# Patient Record
Sex: Female | Born: 1970 | Race: Black or African American | Hispanic: No | Marital: Single | Smoking: Never smoker
Health system: Southern US, Community
[De-identification: ages and names within clinical notes are randomized; demographics above are authoritative.]

## PROBLEM LIST (undated history)

## (undated) DIAGNOSIS — I10 Essential (primary) hypertension: Secondary | ICD-10-CM

## (undated) DIAGNOSIS — I82409 Acute embolism and thrombosis of unspecified deep veins of unspecified lower extremity: Secondary | ICD-10-CM

---

## 1999-10-30 ENCOUNTER — Emergency Department (HOSPITAL_COMMUNITY): Admission: EM | Admit: 1999-10-30 | Discharge: 1999-10-30 | Payer: Self-pay | Admitting: Emergency Medicine

## 2005-05-08 ENCOUNTER — Emergency Department: Payer: Self-pay | Admitting: Emergency Medicine

## 2005-12-19 ENCOUNTER — Emergency Department: Payer: Self-pay | Admitting: Emergency Medicine

## 2006-03-12 ENCOUNTER — Emergency Department: Payer: Self-pay | Admitting: Internal Medicine

## 2006-06-15 ENCOUNTER — Emergency Department: Payer: Self-pay | Admitting: Emergency Medicine

## 2007-04-14 ENCOUNTER — Emergency Department: Payer: Self-pay | Admitting: Emergency Medicine

## 2007-05-29 ENCOUNTER — Emergency Department: Payer: Self-pay | Admitting: Emergency Medicine

## 2007-10-16 ENCOUNTER — Emergency Department: Payer: Self-pay

## 2008-01-25 ENCOUNTER — Emergency Department: Payer: Self-pay | Admitting: Emergency Medicine

## 2008-06-06 ENCOUNTER — Emergency Department: Payer: Self-pay | Admitting: Emergency Medicine

## 2008-07-23 ENCOUNTER — Emergency Department: Payer: Self-pay | Admitting: Emergency Medicine

## 2008-10-14 ENCOUNTER — Inpatient Hospital Stay: Payer: Self-pay | Admitting: Internal Medicine

## 2010-02-07 ENCOUNTER — Ambulatory Visit: Payer: Self-pay | Admitting: Orthopedic Surgery

## 2011-06-09 ENCOUNTER — Emergency Department: Payer: Self-pay | Admitting: Emergency Medicine

## 2011-10-07 ENCOUNTER — Emergency Department: Payer: Self-pay | Admitting: Emergency Medicine

## 2012-01-02 ENCOUNTER — Emergency Department: Payer: Self-pay | Admitting: Emergency Medicine

## 2012-07-16 ENCOUNTER — Ambulatory Visit: Payer: Self-pay

## 2012-07-16 LAB — DOT URINE DIP
Glucose,UR: NEGATIVE mg/dL (ref 0–75)
Specific Gravity: 1.025 (ref 1.003–1.030)

## 2012-08-23 ENCOUNTER — Emergency Department: Payer: Self-pay | Admitting: Emergency Medicine

## 2013-10-08 ENCOUNTER — Emergency Department: Payer: Self-pay | Admitting: Emergency Medicine

## 2013-10-12 ENCOUNTER — Emergency Department: Payer: Self-pay | Admitting: Emergency Medicine

## 2014-08-28 ENCOUNTER — Emergency Department: Payer: Self-pay

## 2014-08-28 ENCOUNTER — Emergency Department
Admission: EM | Admit: 2014-08-28 | Discharge: 2014-08-28 | Disposition: A | Discharge status: Home / Self Care | Payer: Self-pay | Attending: Emergency Medicine | Admitting: Emergency Medicine

## 2014-08-28 DIAGNOSIS — S86811A Strain of other muscle(s) and tendon(s) at lower leg level, right leg, initial encounter: Secondary | ICD-10-CM | POA: Insufficient documentation

## 2014-08-28 DIAGNOSIS — S86911A Strain of unspecified muscle(s) and tendon(s) at lower leg level, right leg, initial encounter: Secondary | ICD-10-CM

## 2014-08-28 DIAGNOSIS — X58XXXA Exposure to other specified factors, initial encounter: Secondary | ICD-10-CM | POA: Insufficient documentation

## 2014-08-28 DIAGNOSIS — Y92003 Bedroom of unspecified non-institutional (private) residence as the place of occurrence of the external cause: Secondary | ICD-10-CM | POA: Insufficient documentation

## 2014-08-28 DIAGNOSIS — Y9389 Activity, other specified: Secondary | ICD-10-CM | POA: Insufficient documentation

## 2014-08-28 DIAGNOSIS — M25461 Effusion, right knee: Secondary | ICD-10-CM | POA: Insufficient documentation

## 2014-08-28 DIAGNOSIS — Y998 Other external cause status: Secondary | ICD-10-CM | POA: Insufficient documentation

## 2014-08-28 MED ORDER — IBUPROFEN 800 MG PO TABS: 800.0000 mg | ORAL_TABLET | Freq: Three times a day (TID) | ORAL | Status: DC | PRN

## 2014-08-28 MED ORDER — HYDROCODONE-ACETAMINOPHEN 5-325 MG PO TABS
2.0000 | ORAL_TABLET | Freq: Once | ORAL | Status: AC
  Administered 2014-08-28: 2 via ORAL
  Filled 2014-08-28: qty 2

## 2014-08-28 MED ORDER — HYDROCODONE-ACETAMINOPHEN 5-325 MG PO TABS: 1.0000 | ORAL_TABLET | ORAL | Status: DC | PRN

## 2014-08-28 MED ORDER — KETOROLAC TROMETHAMINE 60 MG/2ML IM SOLN
60.0000 mg | Freq: Once | INTRAMUSCULAR | Status: AC
  Administered 2014-08-28: 60 mg via INTRAMUSCULAR
  Filled 2014-08-28: qty 2

## 2014-08-28 NOTE — ED Provider Notes (Signed)
[facility] Emergency Department Provider Note  ____________________________________________  Time seen: Approximately 3:00 PM  I have reviewed the triage vital signs and the nursing notes.   HISTORY  Chief Complaint Knee Pain    HPI Stephanie Booth is a 44 y.o. female who presents for evaluation of pain in her right knee. Patient states that she got out of bed 2 days ago and felt a tearing sensation in her knee.Complains of increased pain in the right knee. Symptoms are worsened with ambulation and relieved with laying flat.   No past medical history on file.  There are no active problems to display for this patient.   No past surgical history on file.  Current Outpatient Rx  Name  Route  Sig  Dispense  Refill  . HYDROcodone-acetaminophen (NORCO) 5-325 MG per tablet   Oral   Take 1-2 tablets by mouth every 4 (four) hours as needed for moderate pain.   15 tablet   0   . ibuprofen (ADVIL,MOTRIN) 800 MG tablet   Oral   Take 1 tablet (800 mg total) by mouth every 8 (eight) hours as needed.   30 tablet   0     Allergies Review of patient's allergies indicates no known allergies.  No family history on file.  Social History History  Substance Use Topics  . Smoking status: Not on file  . Smokeless tobacco: Not on file  . Alcohol Use: Not on file    Review of Systems Constitutional: No fever/chills Eyes: No visual changes. ENT: No sore throat. Cardiovascular: Denies chest pain. Respiratory: Denies shortness of breath. Gastrointestinal: No abdominal pain.  No nausea, no vomiting.  No diarrhea.  No constipation. Genitourinary: Negative for dysuria. Musculoskeletal: Positive for right knee pain. Skin: Negative for rash. Neurological: Negative for headaches, focal weakness or numbness.  10-point ROS otherwise negative.  ____________________________________________   PHYSICAL EXAM:  VITAL SIGNS: ED Triage Vitals  Enc Vitals  Group     BP 08/28/14 1328 146/83 mmHg     Pulse Rate 08/28/14 1328 96     Resp 08/28/14 1328 18     Temp 08/28/14 1328 97.8 F (36.6 C)     Temp Source 08/28/14 1328 Oral     SpO2 08/28/14 1328 97 %     Weight 08/28/14 1328 295 lb (133.811 kg)     Height 08/28/14 1328 5\' 9"  (1.753 m)     Head Cir --      Peak Flow --      Pain Score 08/28/14 1341 10     Pain Loc --      Pain Edu? --      Excl. in GC? --     Constitutional: Alert and oriented. Well appearing and in mild distress. Morbidly obese. Cardiovascular: Normal rate, regular rhythm. Grossly normal heart sounds.  Good peripheral circulation. Respiratory: Normal respiratory effort.  No retractions. Lungs CTAB. Gastrointestinal: Soft and nontender. No distention. No abdominal bruits. No CVA tenderness. Musculoskeletal: Tender both anterior and laterally medially. Increased pain with anterior and posterior drawer. Difficult to determine to edema secondary to obesity. Neurologic:  Normal speech and language. No gross focal neurologic deficits are appreciated. No gait instability. Skin:  Skin is warm, dry and intact. No rash noted. Psychiatric: Mood and affect are normal. Speech and behavior are normal.  ____________________________________________   LABS (all labs ordered are listed, but only abnormal results are displayed)  Labs Reviewed - No data to display ____________________________________________  RADIOLOGY  Tri-compartmental arthritis interpreted by radiologist few by myself. Patient had both CT and x-rays of her right knee. ____________________________________________   PROCEDURES  Procedure(s) performed: None  Critical Care performed: No  ____________________________________________   INITIAL IMPRESSION / ASSESSMENT AND PLAN / ED COURSE  Pertinent labs & imaging results that were available during my care of the patient were reviewed by me and considered in my medical decision making (see chart for  details).  Degenerative joint changes and tract with marked mental arthritis of right knee. Rx given for Flexeril 10 mg 3 times a day continue current medications follow-up as needed with orthopedics or her PCP. ____________________________________________   FINAL CLINICAL IMPRESSION(S) / ED DIAGNOSES  Final diagnoses:  Knee strain, right, initial encounter  Knee effusion, right      Evangeline Dakin, PA-C 08/28/14 1840  Arnaldo Natal, MD 08/28/14 2325

## 2014-08-28 NOTE — ED Notes (Signed)
Pt states Wednesday when she stepped down she heard a tearing noise and had pain in the rt knee, pt states that she has been having pain since with swelling in the knee and ankle, pt states that she cont to have pain

## 2014-08-28 NOTE — Discharge Instructions (Signed)
Knee Effusion The medical term for having fluid in your knee is effusion. This is often due to an internal derangement of the knee. This means something is wrong inside the knee. Some of the causes of fluid in the knee may be torn cartilage, a torn ligament, or bleeding into the joint from an injury. Your knee is likely more difficult to bend and move. This is often because there is increased pain and pressure in the joint. The time it takes for recovery from a knee effusion depends on different factors, including:   Type of injury.  Your age.  Physical and medical conditions.  Rehabilitation Strategies. How long you will be away from your normal activities will depend on what kind of knee problem you have and how much damage is present. Your knee has two types of cartilage. Articular cartilage covers the bone ends and lets your knee bend and move smoothly. Two menisci, thick pads of cartilage that form a rim inside the joint, help absorb shock and stabilize your knee. Ligaments bind the bones together and support your knee joint. Muscles move the joint, help support your knee, and take stress off the joint itself. CAUSES  Often an effusion in the knee is caused by an injury to one of the menisci. This is often a tear in the cartilage. Recovery after a meniscus injury depends on how much meniscus is damaged and whether you have damaged other knee tissue. Small tears may heal on their own with conservative treatment. Conservative means rest, limited weight bearing activity and muscle strengthening exercises. Your recovery may take up to 6 weeks.  TREATMENT  Larger tears may require surgery. Meniscus injuries may be treated during arthroscopy. Arthroscopy is a procedure in which your surgeon uses a small telescope like instrument to look in your knee. Your caregiver can make a more accurate diagnosis (learning what is wrong) by performing an arthroscopic procedure. If your injury is on the inner margin  of the meniscus, your surgeon may trim the meniscus back to a smooth rim. In other cases your surgeon will try to repair a damaged meniscus with stitches (sutures). This may make rehabilitation take longer, but may provide better long term result by helping your knee keep its shock absorption capabilities. Ligaments which are completely torn usually require surgery for repair. HOME CARE INSTRUCTIONS  Use crutches as instructed.  If a brace is applied, use as directed.  Once you are home, an ice pack applied to your swollen knee may help with discomfort and help decrease swelling.  Keep your knee raised (elevated) when you are not up and around or on crutches.  Only take over-the-counter or prescription medicines for pain, discomfort, or fever as directed by your caregiver.  Your caregivers will help with instructions for rehabilitation of your knee. This often includes strengthening exercises.  You may resume a normal diet and activities as directed. SEEK MEDICAL CARE IF:   There is increased swelling in your knee.  You notice redness, swelling, or increasing pain in your knee.  An unexplained oral temperature above 102 F (38.9 C) develops. SEEK IMMEDIATE MEDICAL CARE IF:   You develop a rash.  You have difficulty breathing.  You have any allergic reactions from medications you may have been given.  There is severe pain with any motion of the knee. MAKE SURE YOU:   Understand these instructions.  Will watch your condition.  Will get help right away if you are not doing well or get worse.  Document Released: 04/08/2003 Document Revised: 04/10/2011 Document Reviewed: 06/12/2007 [facility] Patient Information 2015 [place], [place]. This information is not intended to replace advice given to you by your health care provider. Make sure you discuss any questions you have with your health care provider.  Ligament Sprain A ligament sprain is when the bands of tissue that hold bones  together (ligament) are stretched. HOME CARE   Rest the injured area.  Start using the joint when told to by your doctor.  Keep the injured area raised (elevated) above the level of the heart. This may lessen puffiness (swelling).  Put ice on the injured area.  Put ice in a plastic bag.  Place a towel between your skin and the bag.  Leave the ice on for 15-20 minutes, 03-04 times a day.  Wear a splint, cast, or an elastic bandage as told by your doctor.  Only take medicine as told by your doctor.  Use crutches as told by your doctor. Do not put weight on the injured joint until told to by your doctor. GET HELP RIGHT AWAY IF:   You have more bruising, puffiness, or pain.  The leg was injured and the toes are cold, tingling, numb, or blue.  The arm was injured and the fingers are cold, tingling, numb, or blue.  The pain is not helped with medicine.  The pain gets worse. MAKE SURE YOU:   Understand these instructions.  Will watch this condition.  Will get help right away if you are not doing well or get worse. Document Released: 07/05/2007 Document Revised: 11/06/2012 Document Reviewed: 07/05/2007 [facility] Patient Information 2015 [place], [place]. This information is not intended to replace advice given to you by your health care provider. Make sure you discuss any questions you have with your health care provider.

## 2014-08-28 NOTE — ED Notes (Signed)
Few days ago stepped out of bed c/o pain in right knee, has some swelling in right ankle

## 2016-03-19 ENCOUNTER — Encounter: Payer: Self-pay | Admitting: Emergency Medicine

## 2016-03-19 ENCOUNTER — Emergency Department
Admission: EM | Admit: 2016-03-19 | Discharge: 2016-03-19 | Disposition: A | Discharge status: Home / Self Care | Payer: Self-pay | Attending: Emergency Medicine | Admitting: Emergency Medicine

## 2016-03-19 DIAGNOSIS — H6992 Unspecified Eustachian tube disorder, left ear: Secondary | ICD-10-CM | POA: Insufficient documentation

## 2016-03-19 DIAGNOSIS — H6982 Other specified disorders of Eustachian tube, left ear: Secondary | ICD-10-CM

## 2016-03-19 MED ORDER — PREDNISONE 10 MG PO TABS: ORAL_TABLET | ORAL | 0 refills | Status: DC

## 2016-03-19 NOTE — Discharge Instructions (Signed)
Follow up with your doctor Tuesday with your ear problems. Begin taking prednisone today and taper down.  Continue taking flonase daily. You may continue taking tylenol or ibuprofen as needed for pain

## 2016-03-19 NOTE — ED Provider Notes (Signed)
[facility] Emergency Department Provider Note  ____________________________________________   First MD Initiated Contact with Patient 03/19/16 1046     (approximate)  I have reviewed the triage vital signs and the nursing notes.   HISTORY  Chief Complaint Otalgia    HPI Stephanie Booth is a 46 y.o. female is able complaint of left ear pain for approximately 2-3 weeks. She has been using multiple home remedies including sweet oil, olive oil, over the counter ear drops and ibuprofen.  She googled more home remedies and tried several without any improvement. She states she continues to use Flonase for sinus problems. She denies any fever. Patient denies any upper respiratory symptoms. She is unaware of any hearing changes. She states that she has an appointment with her PCP on Tuesday but cannot endure the pain any longer. Currently she rates her pain as a 10 over 10.   History reviewed. No pertinent past medical history.  There are no active problems to display for this patient.   History reviewed. No pertinent surgical history.  Prior to Admission medications   Medication Sig Start Date End Date Taking? Authorizing Provider  predniSONE (DELTASONE) 10 MG tablet Take 6 tablets  today, on day 2 take 5 tablets, day 3 take 4 tablets, day 4 take 3 tablets, day 5 take  2 tablets and 1 tablet the last day 03/19/16   Tommi Rumpshonda L Terisha Losasso, PA-C    Allergies Patient has no known allergies.  No family history on file.  Social History Social History  Substance Use Topics  . Smoking status: Never Smoker  . Smokeless tobacco: Never Used  . Alcohol use No    Review of Systems Constitutional: Subjective fever/chills Eyes: No visual changes. ENT: No sore throat. Positive left ear pain. Cardiovascular: Denies chest pain. Respiratory: Denies shortness of breath. Gastrointestinal: No abdominal pain.  No nausea, no vomiting.   Musculoskeletal: Negative for back  pain. Skin: Negative for rash. Neurological: Negative for headaches, focal weakness or numbness.  10-point ROS otherwise negative.  ____________________________________________   PHYSICAL EXAM:  VITAL SIGNS: ED Triage Vitals  Enc Vitals Group     BP 03/19/16 1019 (!) 149/69     Pulse Rate 03/19/16 1019 94     Resp 03/19/16 1019 16     Temp 03/19/16 1019 98 F (36.7 C)     Temp src --      SpO2 03/19/16 1019 98 %     Weight 03/19/16 1016 (!) 350 lb (158.8 kg)     Height 03/19/16 1016 5\' 10"  (1.778 m)     Head Circumference --      Peak Flow --      Pain Score 03/19/16 1016 10     Pain Loc --      Pain Edu? --      Excl. in GC? --     Constitutional: Alert and oriented. Well appearing and in no acute distress.Morbidly obese. Eyes: Conjunctivae are normal. PERRL. EOMI. Head: Atraumatic. Nose: Mild congestion/rhinnorhea.  Right EAC and TM are clear. Left EAC is clear. TM with mild fluid and dull. No injection or erythema was noted. Patient was unable to get her ears to pop and increased the pain on her left side. Mouth/Throat: Mucous membranes are moist.  Oropharynx non-erythematous. Posterior drainage Neck: No stridor.   Hematological/Lymphatic/Immunilogical: No cervical lymphadenopathy. Cardiovascular: Normal rate, regular rhythm. Grossly normal heart sounds.  Good peripheral circulation. Respiratory: Normal respiratory effort.  No retractions. Lungs CTAB.  Musculoskeletal: Moves upper and lower extremities without any difficulty.  Normal gait was noted. Neurologic:  Normal speech and language. No gross focal neurologic deficits are appreciated. No gait instability. Skin:  Skin is warm, dry and intact. No rash noted. Psychiatric: Mood and affect are normal. Speech and behavior are normal.  ____________________________________________   LABS (all labs ordered are listed, but only abnormal results are displayed)  Labs Reviewed - No data to  display  PROCEDURES  Procedure(s) performed: None  Procedures  Critical Care performed: No  ____________________________________________   INITIAL IMPRESSION / ASSESSMENT AND PLAN / ED COURSE  Pertinent labs & imaging results that were available during my care of the patient were reviewed by me and considered in my medical decision making (see chart for details).  Patient was told to continue using her Flonase daily. She may continue taking Tylenol or ibuprofen as needed for pain. Patient is encouraged to keep her appointment Tuesday with her PCP. She was started on a tapering dose of prednisone starting at 60 mg and taper for the next 6 days.    ____________________________________________   FINAL CLINICAL IMPRESSION(S) / ED DIAGNOSES  Final diagnoses:  Eustachian tube dysfunction, left      NEW MEDICATIONS STARTED DURING THIS VISIT:  Discharge Medication List as of 03/19/2016 11:10 AM    START taking these medications   Details  predniSONE (DELTASONE) 10 MG tablet Take 6 tablets  today, on day 2 take 5 tablets, day 3 take 4 tablets, day 4 take 3 tablets, day 5 take  2 tablets and 1 tablet the last day, Print         Note:  This document was prepared using Dragon voice recognition software and may include unintentional dictation errors.    Tommi Rumps, PA-C 03/19/16 1118    Sharman Cheek, MD 03/19/16 [phone]

## 2016-03-19 NOTE — ED Notes (Signed)
See triage note  States she developed left ear pain about 1 week ago or so  States pain eased off but then returned couple of days ago

## 2016-03-19 NOTE — ED Triage Notes (Signed)
Pt to ED via POV for left ear pain, patient has tried home remedies without relief. Pt states that she has had fevers, Pt denies nasal congestion and cough.

## 2017-02-06 IMAGING — CR DG KNEE COMPLETE 4+V*R*
4 series · 4 of 4 positions shown · non-contrast
Comparison: None.

CLINICAL DATA: Fell out of bed last night.  Unable to bear weight.

EXAM:
RIGHT KNEE - COMPLETE 4+ VIEW

[knee ap]
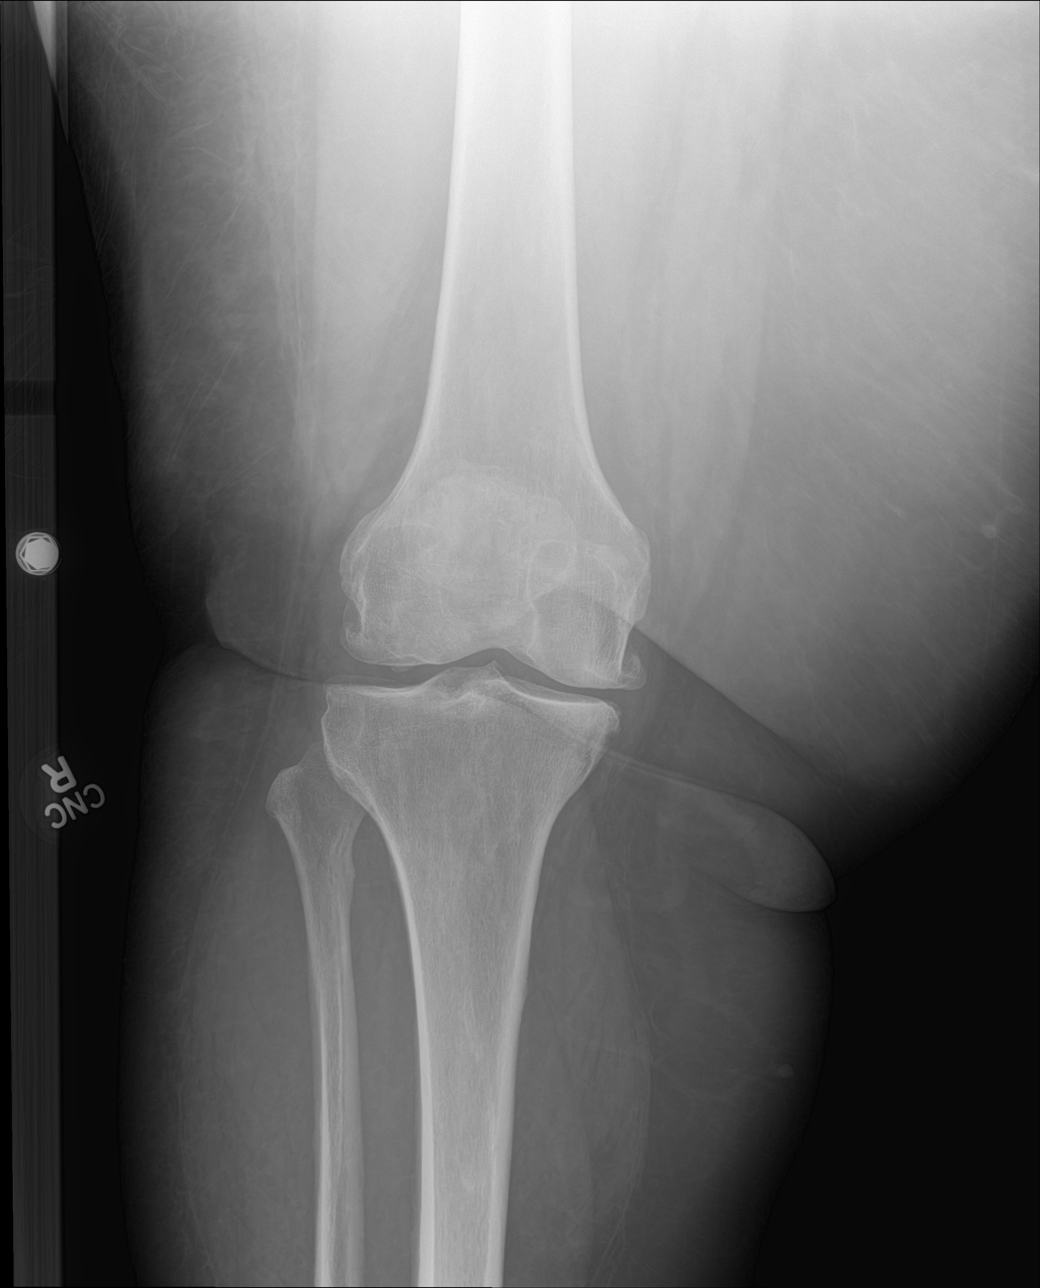

[knee obl (1 of 2)]
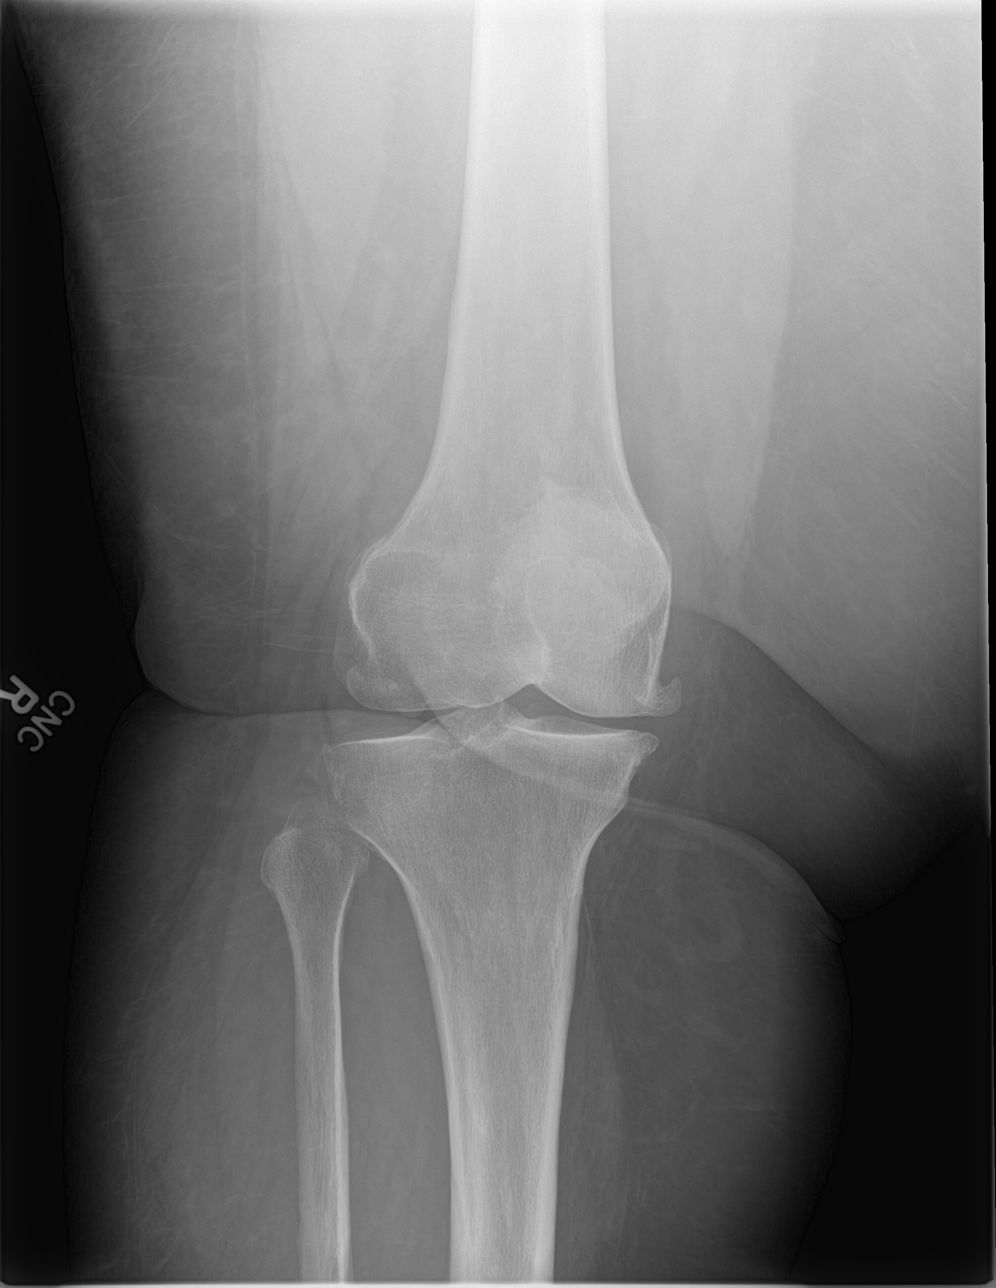

[knee obl (2 of 2)]
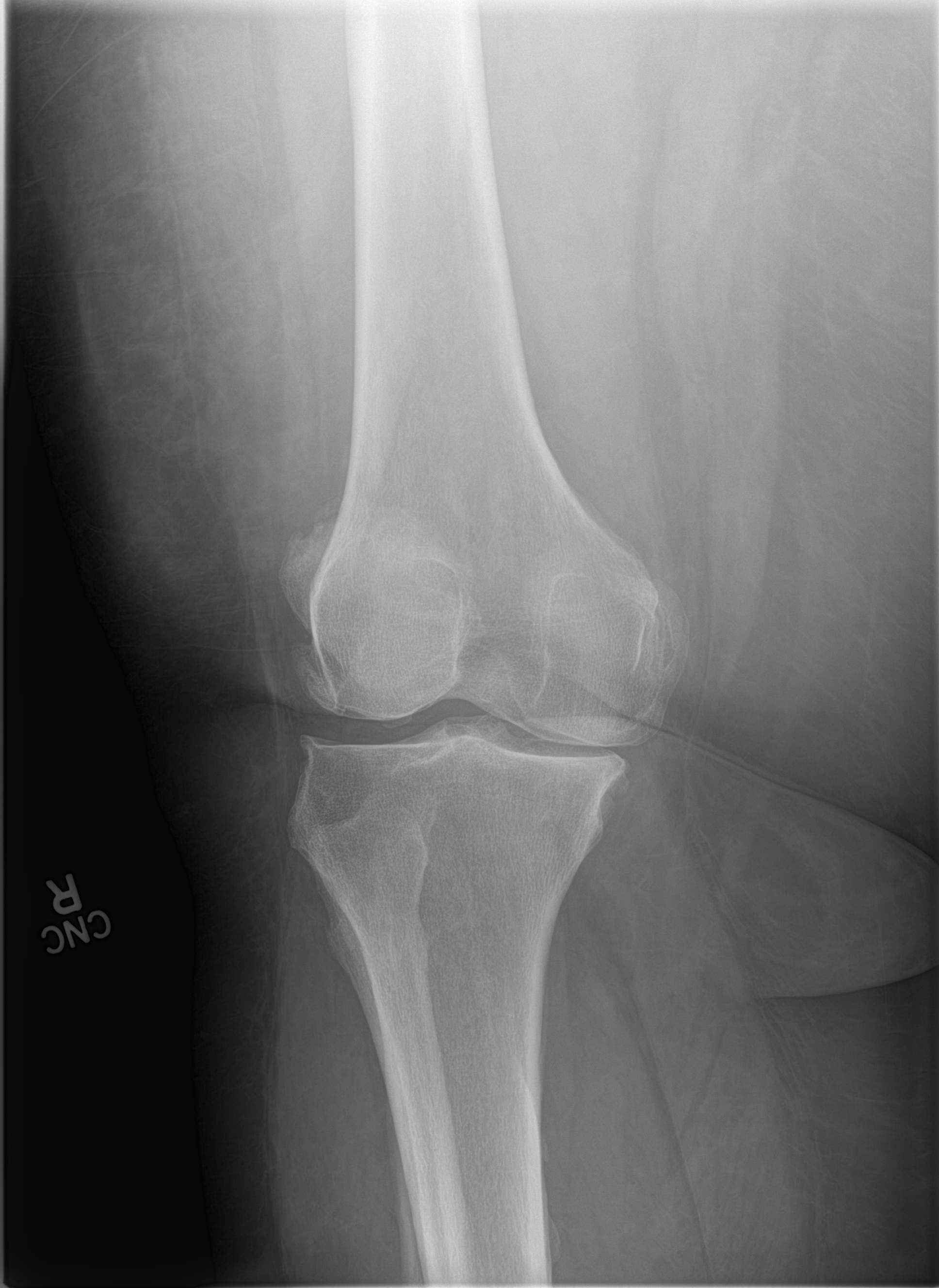

[knee lat]
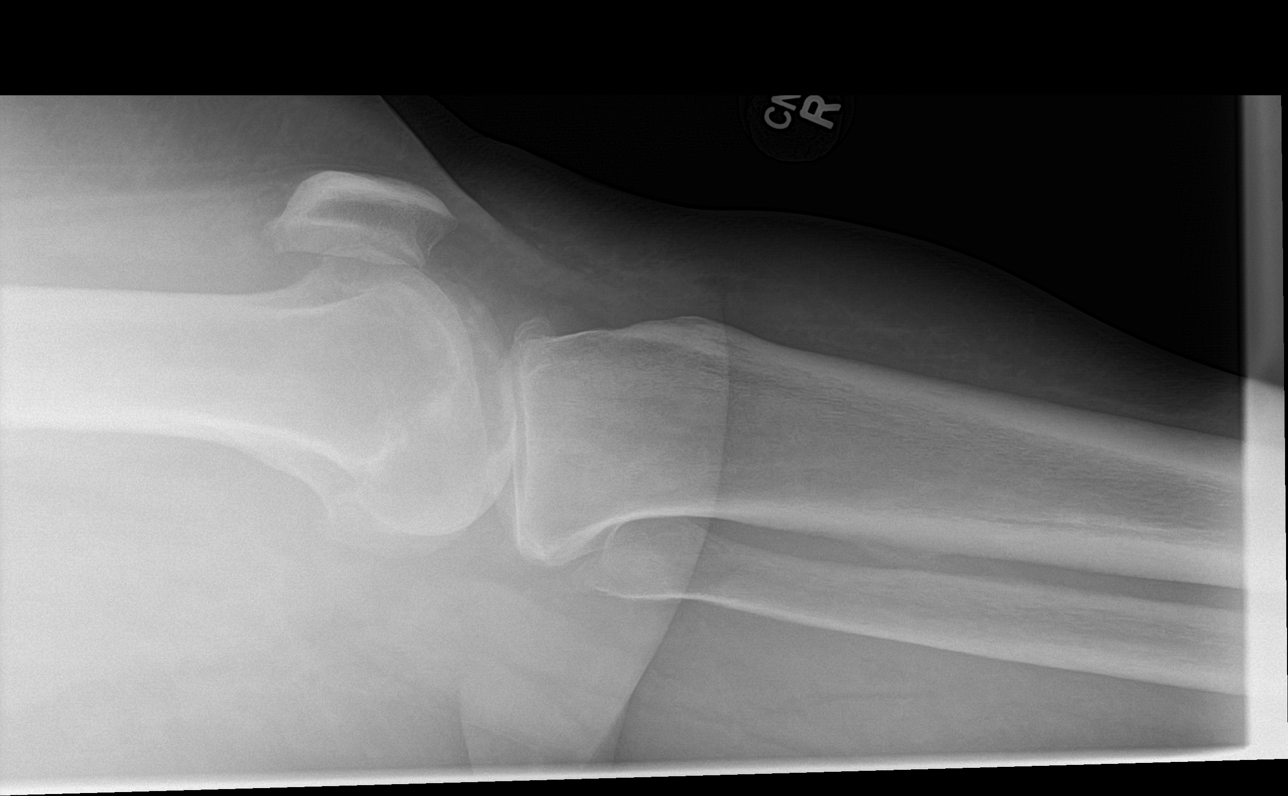

[4 of 4 positions shown; findings below may reference images not displayed]

FINDINGS: No acute fracture or dislocation. Mild tricompartmental
osteoarthritis of the right knee. No joint effusion. No lytic or
blastic osseous lesion.
IMPRESSION: Mild tricompartmental osteoarthritis of the right knee.

## 2017-02-06 IMAGING — CT CT KNEE*R* W/O CM
2 series · 10 of 14 positions shown, 11 images · non-contrast
Comparison: Radiographs dated 08/28/2014

CLINICAL DATA: Right knee pain and swelling since the patient
injured knee while stepping down 2 days ago.

EXAM:
CT OF THE RIGHT KNEE WITHOUT CONTRAST
TECHNIQUE: Multidetector CT imaging of the right knee was performed according
to the standard protocol. Multiplanar CT image reconstructions were
also generated.

[Series 2: knee · axial · 0.54mm/px · z∈[-195,-51]mm · 6 of 136 slices shown]
[im 20/136  bone]
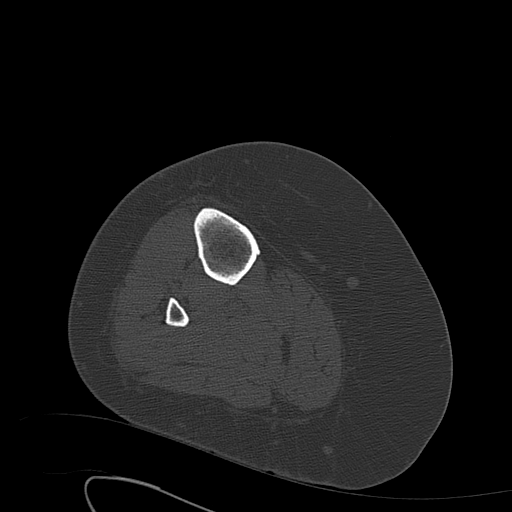
[im 39/136  bone]
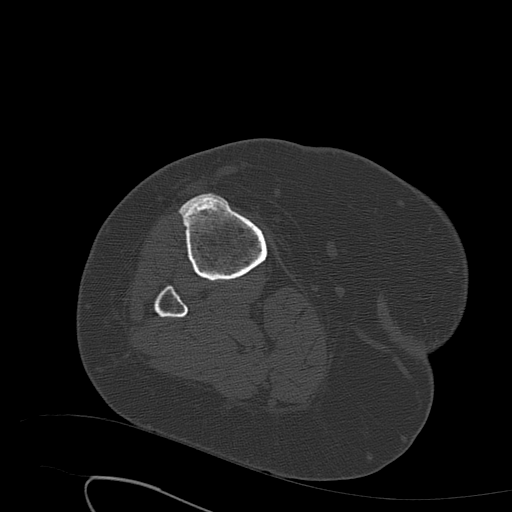
[im 58/136  bone]
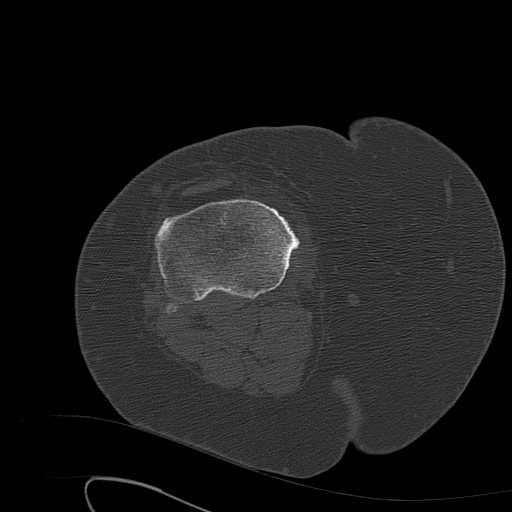
[im 78/136  bone]
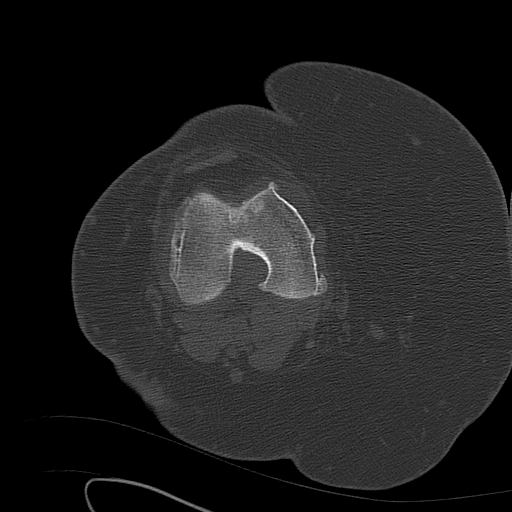
[im 97/136  bone]
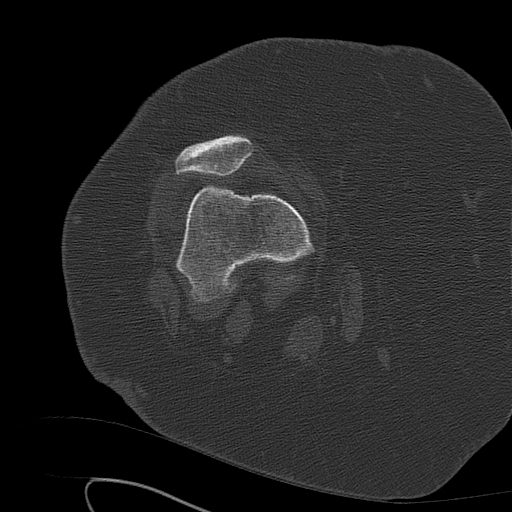
[im 116/136  bone]
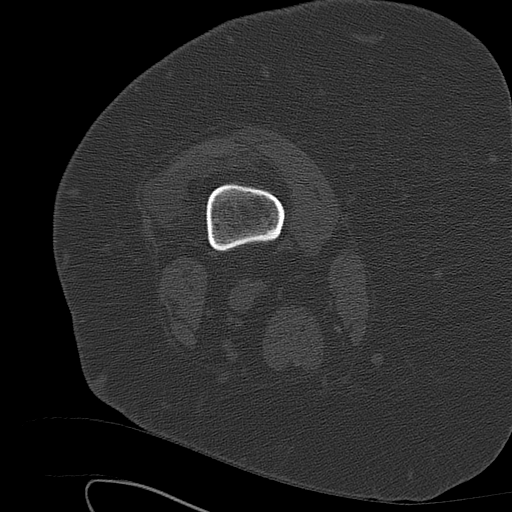

[Series 8: axials st 2 · axial · 0.51mm/px · z∈[-184,-66]mm · 4 of 102 slices shown, 5 images]
[im 21/102  soft-tissue]
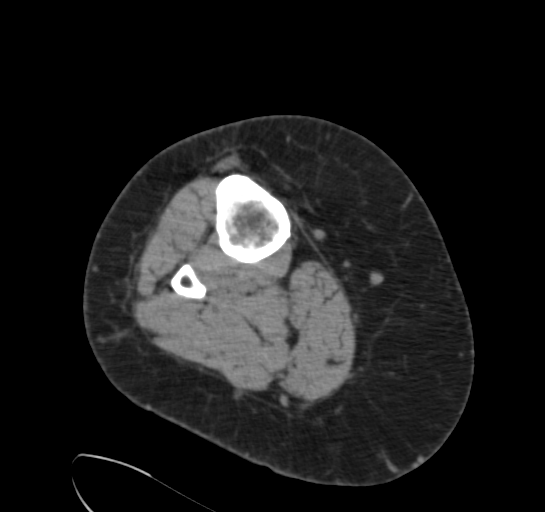
[im 21/102  bone]
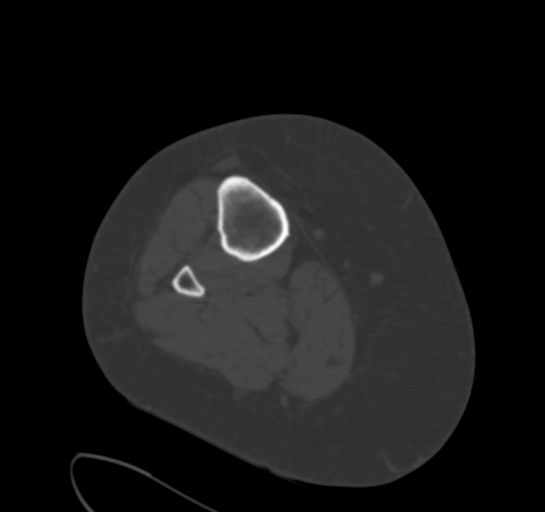
[im 41/102  bone]
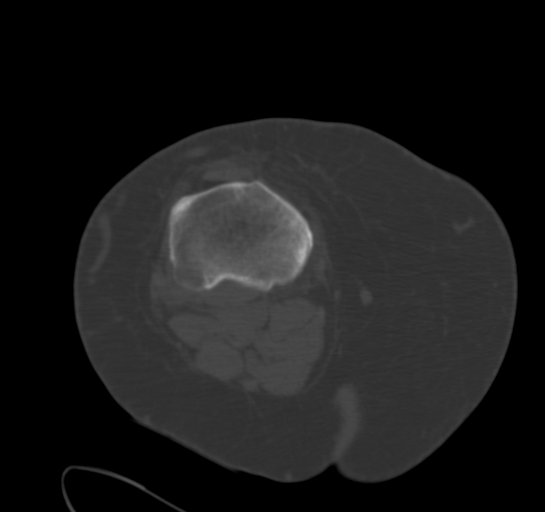
[im 61/102  bone]
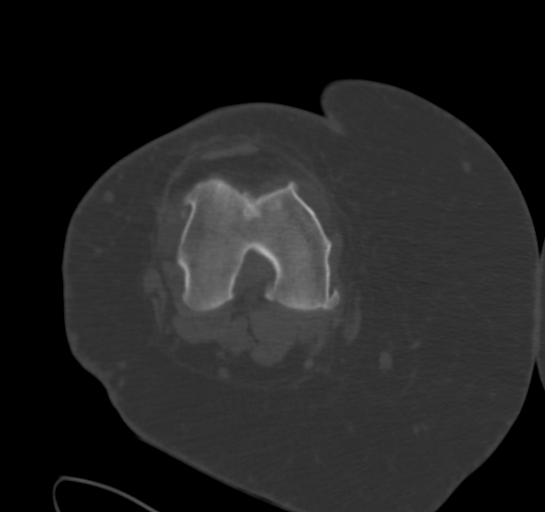
[im 81/102  bone]
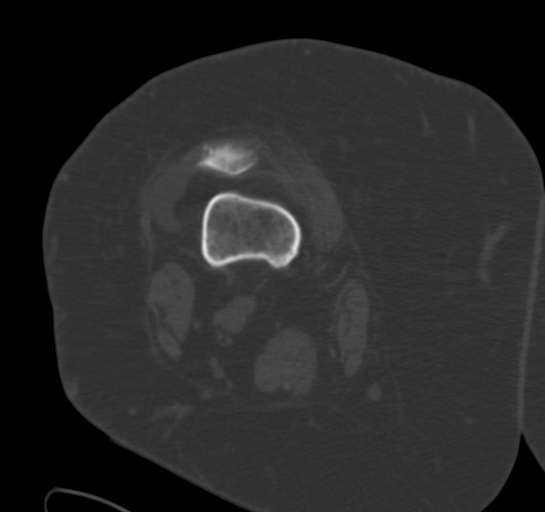

[10 of 14 positions shown; findings below may reference images not displayed]

FINDINGS: There is no fracture or dislocation. There is a small joint
effusion. The patient has severe medial compartment osteoarthritis
and to a slightly lesser degree in the patellofemoral compartment
and in the lateral compartment.

The cruciate and collateral ligaments are intact. The menisci are
not well enough seen for evaluation but the medial meniscus is
peripherally subluxed due to the medial joint space narrowing. The
distal quadriceps tendon and patellar tendon are intact.

No Baker's cyst.
IMPRESSION: 1. Tricompartmental osteoarthritis, most severe in the medial
compartment with full-thickness cartilage loss and prominent
marginal osteophytes.
2. Small joint effusion.
3. No fracture or dislocation.

## 2019-01-27 ENCOUNTER — Ambulatory Visit: Payer: HRSA Program | Attending: Internal Medicine

## 2019-01-27 DIAGNOSIS — Z20828 Contact with and (suspected) exposure to other viral communicable diseases: Secondary | ICD-10-CM | POA: Insufficient documentation

## 2019-01-27 DIAGNOSIS — Z20822 Contact with and (suspected) exposure to covid-19: Secondary | ICD-10-CM

## 2019-01-29 LAB — NOVEL CORONAVIRUS, NAA: SARS-CoV-2, NAA: NOT DETECTED

## 2021-01-06 ENCOUNTER — Encounter: Payer: Self-pay | Admitting: Emergency Medicine

## 2021-01-06 ENCOUNTER — Emergency Department: Payer: Self-pay

## 2021-01-06 ENCOUNTER — Other Ambulatory Visit: Payer: Self-pay

## 2021-01-06 ENCOUNTER — Emergency Department
Admission: EM | Admit: 2021-01-06 | Discharge: 2021-01-06 | Disposition: A | Discharge status: Home / Self Care | Payer: Self-pay | Attending: Student in an Organized Health Care Education/Training Program | Admitting: Student in an Organized Health Care Education/Training Program

## 2021-01-06 DIAGNOSIS — M5416 Radiculopathy, lumbar region: Secondary | ICD-10-CM | POA: Insufficient documentation

## 2021-01-06 MED ORDER — KETOROLAC TROMETHAMINE 30 MG/ML IJ SOLN
30.0000 mg | Freq: Once | INTRAMUSCULAR | Status: AC
  Administered 2021-01-06: 30 mg via INTRAMUSCULAR
  Filled 2021-01-06: qty 1

## 2021-01-06 MED ORDER — DEXAMETHASONE SODIUM PHOSPHATE 10 MG/ML IJ SOLN
10.0000 mg | Freq: Once | INTRAMUSCULAR | Status: AC
  Administered 2021-01-06: 10 mg via INTRAMUSCULAR
  Filled 2021-01-06: qty 1

## 2021-01-06 MED ORDER — MELOXICAM 15 MG PO TABS: 15.0000 mg | ORAL_TABLET | Freq: Every day | ORAL | 0 refills | Status: AC

## 2021-01-06 MED ORDER — METHOCARBAMOL 500 MG PO TABS: 500.0000 mg | ORAL_TABLET | Freq: Four times a day (QID) | ORAL | 0 refills | Status: AC

## 2021-01-06 NOTE — ED Provider Notes (Signed)
[facility] Emergency Department Provider Note  ____________________________________________  Time seen: Approximately 6:39 PM  I have reviewed the triage vital signs and the nursing notes.   HISTORY  Chief Complaint Numbness (/)    HPI Stephanie Booth is a 50 y.o. female who presents the emergency department complaining of lower back radiating down her leg pain.  Patient has a history of lumbar radiculopathy.  She has been having a burning sensation in her leg that is now traveling down to her foot.  She had epidural injection that had alleviated most of her back pain about a month ago.  The pain is not so much in her back as it is her hip but it is running all the Booth down to her foot.  No loss of sensation.  No bowel or bladder dysfunction, saddle anesthesias or paresthesias.  No recent trauma.  No urinary or GI symptoms.       History reviewed. No pertinent past medical history.  There are no problems to display for this patient.   History reviewed. No pertinent surgical history.  Prior to Admission medications   Medication Sig Start Date End Date Taking? Authorizing Provider  meloxicam (MOBIC) 15 MG tablet Take 1 tablet (15 mg total) by mouth daily. 01/06/21  Yes Adelina Collard, Delorise Royals, PA-C  methocarbamol (ROBAXIN) 500 MG tablet Take 1 tablet (500 mg total) by mouth 4 (four) times daily. 01/06/21  Yes Marinna Blane, Delorise Royals, PA-C  predniSONE (DELTASONE) 10 MG tablet Take 6 tablets  today, on day 2 take 5 tablets, day 3 take 4 tablets, day 4 take 3 tablets, day 5 take  2 tablets and 1 tablet the last day 03/19/16   Tommi Rumps, PA-C    Allergies Strawberry (diagnostic) and Tetracaine  History reviewed. No pertinent family history.  Social History Social History   Tobacco Use   Smoking status: Never   Smokeless tobacco: Never  Substance Use Topics   Alcohol use: No     Review of Systems  Constitutional: No fever/chills Eyes: No  visual changes. No discharge ENT: No upper respiratory complaints. Cardiovascular: no chest pain. Respiratory: no cough. No SOB. Gastrointestinal: No abdominal pain.  No nausea, no vomiting.  Musculoskeletal: Burning pain from hip to the foot Skin: Negative for rash, abrasions, lacerations, ecchymosis. Neurological: Negative for headaches, focal weakness or numbness.  10 System ROS otherwise negative.  ____________________________________________   PHYSICAL EXAM:  VITAL SIGNS: ED Triage Vitals  Enc Vitals Group     BP 01/06/21 1810 (!) 137/113     Pulse Rate 01/06/21 1810 (!) 110     Resp 01/06/21 1810 20     Temp 01/06/21 1810 98.2 F (36.8 C)     Temp Source 01/06/21 1810 Oral     SpO2 01/06/21 1810 100 %     Weight 01/06/21 1808 (!) 398 lb (180.5 kg)     Height 01/06/21 1808 5\' 10"  (1.778 m)     Head Circumference --      Peak Flow --      Pain Score 01/06/21 1808 10     Pain Loc --      Pain Edu? --      Excl. in GC? --      Constitutional: Alert and oriented. Well appearing and in no acute distress. Eyes: Conjunctivae are normal. PERRL. EOMI. Head: Atraumatic. ENT:      Ears:       Nose: No congestion/rhinnorhea.  Mouth/Throat: Mucous membranes are moist.  Neck: No stridor.    Cardiovascular: Normal rate, regular rhythm. Normal S1 and S2.  Good peripheral circulation. Respiratory: Normal respiratory effort without tachypnea or retractions. Lungs CTAB. Good air entry to the bases with no decreased or absent breath sounds. Gastrointestinal: Bowel sounds 4 quadrants. Soft and nontender to palpation. No guarding or rigidity. No palpable masses. No distention. No CVA tenderness. Musculoskeletal: Full range of motion to all extremities. No gross deformities appreciated.  No acute findings to lumbar spine on exam.  Full range of motion to the hip, knee, ankle joints.  No loss of sensation in her knee dermatomal distributions.  Sensation intact distally.  Pulses  intact distally. Neurologic:  Normal speech and language. No gross focal neurologic deficits are appreciated.  Skin:  Skin is warm, dry and intact. No rash noted. Psychiatric: Mood and affect are normal. Speech and behavior are normal. Patient exhibits appropriate insight and judgement.   ____________________________________________   LABS (all labs ordered are listed, but only abnormal results are displayed)  Labs Reviewed - No data to display ____________________________________________  EKG   ____________________________________________  RADIOLOGY I personally viewed and evaluated these images as part of my medical decision making, as well as reviewing the written report by the radiologist.  ED Provider Interpretation: No acute findings on lumbar spine x-ray  DG Lumbar Spine 2-3 Views  Result Date: 01/06/2021 CLINICAL DATA:  Right leg radicular pain EXAM: LUMBAR SPINE - 2-3 VIEW COMPARISON:  None. FINDINGS: Normal lumbar lordosis. 5 mm anterolisthesis of L4 upon L5 and 3 mm anterolisthesis of L3 upon L4 is likely degenerative in nature. No acute fracture of the lumbar spine. Vertebral body height has been preserved. Mild intervertebral disc space narrowing and endplate remodeling at L3-S1 is in keeping with changes of mild degenerative disc disease. Facet arthrosis at these levels is not well profiled on this exam. Paraspinal soft tissues are unremarkable. IMPRESSION: Mild degenerative disc and degenerative joint disease L3-S1. Electronically Signed   By: Helyn Numbers M.D.   On: 01/06/2021 19:24    ____________________________________________    PROCEDURES  Procedure(s) performed:    Procedures    Medications  dexamethasone (DECADRON) injection 10 mg (has no administration in time range)  ketorolac (TORADOL) 30 MG/ML injection 30 mg (has no administration in time range)     ____________________________________________   INITIAL IMPRESSION / ASSESSMENT AND PLAN /  ED COURSE  Pertinent labs & imaging results that were available during my care of the patient were reviewed by me and considered in my medical decision making (see chart for details).  Review of the [place] CSRS was performed in accordance of the [facility] prior to dispensing any controlled drugs.           Patient's diagnosis is consistent with lumbar radiculopathy.  Patient presents emergency department with a burning sensation in her left hip into her foot.  Patient does have some history of lumbar radiculopathy, sees neurosurgery and has had a epidural injection a month ago.  No concerning symptoms of bowel bladder dysfunction, saddle esthesia, paresthesias.  Patient has a reassuring x-ray at this time.  Reassuring exam.  She will be given Toradol and Decadron as she cannot have anything sedating as she is driving.  Patient will have muscle relaxer and anti-inflammatory at home for symptom relief.  Follow-up with neurosurgery as needed.. Patient is given ED precautions to return to the ED for any worsening or new symptoms.     ____________________________________________  FINAL CLINICAL IMPRESSION(S) / ED DIAGNOSES  Final diagnoses:  Lumbar radiculopathy      NEW MEDICATIONS STARTED DURING THIS VISIT:  ED Discharge Orders          Ordered    meloxicam (MOBIC) 15 MG tablet  Daily        01/06/21 2010    methocarbamol (ROBAXIN) 500 MG tablet  4 times daily        01/06/21 2010                This chart was dictated using voice recognition software/Dragon. Despite best efforts to proofread, errors can occur which can change the meaning. Any change was purely unintentional.    Lanette Hampshire 01/06/21 2012    Willy Eddy, MD 01/06/21 2200

## 2021-01-06 NOTE — ED Triage Notes (Addendum)
Pt comes into the ED via POV c/o numbness and a "burning sensation" from the right hip down through to the right thigh.  PT states this started the day after thanksgiving.  Pt states today the numbness started going to her toes.  Pt denies any rash.  Pt ambulatory to triage at this time with even and unlabored respirations. Pt states she works 2 jobs and she stands a lot of her occupations.

## 2023-04-07 ENCOUNTER — Emergency Department: Payer: Self-pay

## 2023-04-07 ENCOUNTER — Emergency Department
Admission: EM | Admit: 2023-04-07 | Discharge: 2023-04-07 | Disposition: A | Discharge status: Home / Self Care | Payer: Self-pay | Attending: Emergency Medicine | Admitting: Emergency Medicine

## 2023-04-07 ENCOUNTER — Other Ambulatory Visit: Payer: Self-pay

## 2023-04-07 DIAGNOSIS — M19042 Primary osteoarthritis, left hand: Secondary | ICD-10-CM | POA: Insufficient documentation

## 2023-04-07 MED ORDER — PREDNISONE 20 MG PO TABS
60.0000 mg | ORAL_TABLET | Freq: Once | ORAL | Status: AC
  Administered 2023-04-07: 60 mg via ORAL
  Filled 2023-04-07: qty 3

## 2023-04-07 MED ORDER — PREDNISONE 10 MG PO TABS: 10.0000 mg | ORAL_TABLET | Freq: Every day | ORAL | 0 refills | Status: AC

## 2023-04-07 NOTE — Discharge Instructions (Signed)
 Your x-rays show arthritis of your carpometacarpal joint of your left thumb.  Please take medication as prescribed and use a thumb brace to help immobilize the thumb.  You may apply ice 20 minutes every hour.  You may apply topical Voltaren gel which can be purchased over-the-counter.  Follow-up with orthopedics in 1 week if no improvement

## 2023-04-07 NOTE — ED Provider Notes (Signed)
 [place] EMERGENCY DEPARTMENT AT [facility] Provider Note   CSN: [id number] Arrival date & time: 04/07/23  1447     History  Chief Complaint  Patient presents with   Hand Pain    Stephanie Booth is a 53 y.o. female.  Presents to the emergency department valuation of left hand pain for a couple of weeks.  No known trauma or injury.  She has pain with grasping and gripping along the base of the thumb along the volar and dorsal aspect of the CMC joint.  No numbness or tingling.  No warmth redness.  She has tried Tylenol and ibuprofen with little relief.  No past medical history, she is not diabetic.  HPI     Home Medications Prior to Admission medications   Medication Sig Start Date End Date Taking? Authorizing Provider  predniSONE (DELTASONE) 10 MG tablet Take 1 tablet (10 mg total) by mouth daily. 6,5,4,3,2,1 six day taper 04/07/23  Yes Evon Slack, PA-C  meloxicam (MOBIC) 15 MG tablet Take 1 tablet (15 mg total) by mouth daily. 01/06/21   Cuthriell, Delorise Royals, PA-C  methocarbamol (ROBAXIN) 500 MG tablet Take 1 tablet (500 mg total) by mouth 4 (four) times daily. 01/06/21   Cuthriell, Delorise Royals, PA-C      Allergies    Strawberry (diagnostic) and Tetracaine    Review of Systems   Review of Systems  Physical Exam Updated Vital Signs BP (!) 150/112   Pulse (!) 115   Temp 98.1 F (36.7 C) (Oral)   Resp 16   Ht 5\' 10"  (1.778 m)   Wt (!) 158.8 kg   LMP 12/28/2020   SpO2 100%   BMI 50.22 kg/m  Physical Exam Constitutional:      Appearance: She is well-developed.  HENT:     Head: Normocephalic and atraumatic.  Eyes:     Conjunctiva/sclera: Conjunctivae normal.  Cardiovascular:     Rate and Rhythm: Normal rate.  Pulmonary:     Effort: Pulmonary effort is normal. No respiratory distress.  Musculoskeletal:        General: Normal range of motion.     Cervical back: Normal range of motion.     Comments: Left hand with no swelling warmth or redness.   Tender along the volar and dorsal aspect of the CMC joint.  Mild crepitation with thumb range of motion.  No deformity.  Tenderness along the A1 pulley of the thumb but no locking, minimal catching noted.  Normal active flexion extension of the digit.  2+ radial pulse.  Skin:    General: Skin is warm.     Findings: No rash.  Neurological:     Mental Status: She is alert and oriented to person, place, and time.  Psychiatric:        Behavior: Behavior normal.        Thought Content: Thought content normal.     ED Results / Procedures / Treatments   Labs (all labs ordered are listed, but only abnormal results are displayed) Labs Reviewed - No data to display  EKG None  Radiology DG Hand Complete Left Result Date: 04/07/2023 CLINICAL DATA:  Left hand injury, pain and swelling. EXAM: LEFT HAND - COMPLETE 3+ VIEW COMPARISON:  None Available. FINDINGS: There is no evidence of fracture or dislocation. Mild osteoarthritis involving the digits, thumb carpal metacarpal joint and radiocarpal joint space. Mild soft tissue edema of the dorsum of the hand. No soft tissue gas. IMPRESSION: 1. No acute fracture  or dislocation of the left hand. 2. Osteoarthritis. Electronically Signed   By: Narda Rutherford M.D.   On: 04/07/2023 15:39    Procedures Procedures    Medications Ordered in ED Medications  predniSONE (DELTASONE) tablet 60 mg (has no administration in time range)    ED Course/ Medical Decision Making/ A&P                                 Medical Decision Making Amount and/or Complexity of Data Reviewed Radiology: ordered.  Risk Prescription drug management.   53 year old female with left hand pain along the base of the thumb.  This is consistent with osteoarthritis that is seen on plain film x-rays that are ordered and independently reviewed by me today.  She also has physical exam findings consistent with pain and discomfort along the [facility] joint.  Recommended a thumb spica brace  that was given today in the emergency department.  She is also placed on prednisone as Tylenol and ibuprofen has been little to no relief.  She is tolerated prednisone well in the past and is not diabetic.  She will call and follow-up with orthopedics in 1 week if no improvement of her symptoms. Final Clinical Impression(s) / ED Diagnoses Final diagnoses:  Localized primary osteoarthritis of left hand    Rx / DC Orders ED Discharge Orders          Ordered    predniSONE (DELTASONE) 10 MG tablet  Daily        04/07/23 1812              Ronnette Juniper 04/07/23 1816    Phineas Semen, MD 04/07/23 Rickey Primus

## 2023-04-07 NOTE — ED Triage Notes (Signed)
 Pt to ED for L hand pain since 2 weeks, worse since 2 days ago. Hand and fingers are swollen compared to L. 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 Moving fingers.

## 2023-05-28 ENCOUNTER — Encounter: Payer: Self-pay | Admitting: *Deleted

## 2023-05-28 ENCOUNTER — Other Ambulatory Visit: Payer: Self-pay

## 2023-05-28 ENCOUNTER — Emergency Department: Payer: Self-pay

## 2023-05-28 DIAGNOSIS — M79605 Pain in left leg: Secondary | ICD-10-CM | POA: Insufficient documentation

## 2023-05-28 DIAGNOSIS — W01198A Fall on same level from slipping, tripping and stumbling with subsequent striking against other object, initial encounter: Secondary | ICD-10-CM | POA: Insufficient documentation

## 2023-05-28 DIAGNOSIS — Z7901 Long term (current) use of anticoagulants: Secondary | ICD-10-CM | POA: Insufficient documentation

## 2023-05-28 DIAGNOSIS — Z86718 Personal history of other venous thrombosis and embolism: Secondary | ICD-10-CM | POA: Insufficient documentation

## 2023-05-28 DIAGNOSIS — M25562 Pain in left knee: Secondary | ICD-10-CM | POA: Insufficient documentation

## 2023-05-28 LAB — BASIC METABOLIC PANEL WITH GFR
Anion gap: 8 (ref 5–15)
BUN: 14 mg/dL (ref 6–20)
CO2: 23 mmol/L (ref 22–32)
Calcium: 8.2 mg/dL — ABNORMAL LOW (ref 8.9–10.3)
Chloride: 106 mmol/L (ref 98–111)
Creatinine, Ser: 0.85 mg/dL (ref 0.44–1.00)
GFR, Estimated: >60 mL/min (ref >60)
Glucose, Bld: 98 mg/dL (ref 70–99)
Potassium: 3.8 mmol/L (ref 3.5–5.1)
Sodium: 137 mmol/L (ref 135–145)

## 2023-05-28 LAB — CBC
HCT: 41.6 % (ref 36.0–46.0)
Hemoglobin: 12.8 g/dL (ref 12.0–15.0)
MCH: 25.4 pg — ABNORMAL LOW (ref 26.0–34.0)
MCHC: 30.8 g/dL (ref 30.0–36.0)
MCV: 82.7 fL (ref 80.0–100.0)
Platelets: 225 10*3/uL (ref 150–400)
RBC: 5.03 MIL/uL (ref 3.87–5.11)
RDW: 15.9 % — ABNORMAL HIGH (ref 11.5–15.5)
WBC: 8.4 10*3/uL (ref 4.0–10.5)
nRBC: 0 % (ref 0.0–0.2)

## 2023-05-28 LAB — PROTIME-INR
INR: 1.1 (ref 0.8–1.2)
Prothrombin Time: 14.4 s (ref 11.4–15.2)

## 2023-05-28 NOTE — ED Triage Notes (Signed)
 Pt ambulatory to triage.  Pt has right leg pain.  Pt has 2 blood clots in right leg and is on blood thinners.  Pt states her leg gave out 3 times today.  No chest pain or sob.  Dx with blood clots 05/11/2023  pt alert.

## 2023-05-29 ENCOUNTER — Emergency Department: Payer: Self-pay

## 2023-05-29 ENCOUNTER — Emergency Department
Admission: EM | Admit: 2023-05-29 | Discharge: 2023-05-29 | Disposition: A | Discharge status: Home / Self Care | Payer: Self-pay | Attending: Emergency Medicine | Admitting: Emergency Medicine

## 2023-05-29 DIAGNOSIS — M25562 Pain in left knee: Secondary | ICD-10-CM

## 2023-05-29 DIAGNOSIS — M79605 Pain in left leg: Secondary | ICD-10-CM

## 2023-05-29 MED ORDER — OXYCODONE-ACETAMINOPHEN 5-325 MG PO TABS
1.0000 | ORAL_TABLET | Freq: Once | ORAL | Status: AC
  Administered 2023-05-29: 1 via ORAL
  Filled 2023-05-29: qty 1

## 2023-05-29 MED ORDER — OXYCODONE-ACETAMINOPHEN 5-325 MG PO TABS: 1.0000 | ORAL_TABLET | ORAL | 0 refills | Status: AC | PRN

## 2023-05-29 NOTE — Discharge Instructions (Signed)
 You may take Ibuprofen  as needed for pain; Percocet as needed for more severe pain.  Use Ace wrap and walker to help you balance as you walk.  Return to the ER for worsening symptoms, persistent vomiting, difficulty breathing or other concerns.

## 2023-05-29 NOTE — ED Provider Notes (Signed)
 [facility] Provider Note    Event Date/Time   First MD Initiated Contact with Patient 05/29/23 0101     (approximate)   History   Leg Pain   HPI  Stephanie Booth is a 53 y.o. female who presents to the ED from home with a chief complaint of left leg pain.  Recently diagnosed on April 10 of this year with left lower leg DVT and placed on Eliquis.  States her leg gave out on her causing her to fall and striking her left knee and shin on the floor.  Denies striking head or LOC.  Voices no complaints of headache, vision changes, neck pain, chest pain, shortness of breath, abdominal pain, nausea, vomiting or dizziness.     Past Medical History  History reviewed. No pertinent past medical history.   Active Problem List  There are no active problems to display for this patient.    Past Surgical History  History reviewed. No pertinent surgical history.   Home Medications   Prior to Admission medications   Medication Sig Start Date End Date Taking? Authorizing Provider  meloxicam  (MOBIC ) 15 MG tablet Take 1 tablet (15 mg total) by mouth daily. 01/06/21   Cuthriell, Jonathan D, PA-C  methocarbamol  (ROBAXIN ) 500 MG tablet Take 1 tablet (500 mg total) by mouth 4 (four) times daily. 01/06/21   Cuthriell, Ardath Bears, PA-C  predniSONE  (DELTASONE ) 10 MG tablet Take 1 tablet (10 mg total) by mouth daily. 6,5,4,3,2,1 six day taper 04/07/23   Coralyn Derry, PA-C     Allergies  Strawberry (diagnostic) and Tetracaine   Family History  History reviewed. No pertinent family history.   Physical Exam  Triage Vital Signs: ED Triage Vitals [05/28/23 2228]  Encounter Vitals Group     BP (!) 164/91     Systolic BP Percentile      Diastolic BP Percentile      Pulse Rate 100     Resp 20     Temp 97.8 F (36.6 C)     Temp Source Oral     SpO2 100 %     Weight (!) 320 lb (145.2 kg)     Height 5\' 9"  (1.753 m)     Head Circumference      Peak Flow       Pain Score 9     Pain Loc      Pain Education      Exclude from Growth Chart     Updated Vital Signs: BP 111/76   Pulse 88   Temp 97.8 F (36.6 C) (Oral)   Resp 20   Ht 5\' 9"  (1.753 m)   Wt (!) 145.2 kg   LMP 12/28/2020   SpO2 96%   BMI 47.26 kg/m    General: Awake, no distress.  CV:  RRR.  Good peripheral perfusion.  Resp:  Normal effort.  CTAB. Abd:  Morbidly obese.  Nontender.  No distention.  Other:  Left anterior knee and shin tender to palpation.  Limited range of motion secondary to pain.  Palpable distal and femoral pulses.  Brisk, less than 5-second cap refill.   ED Results / Procedures / Treatments  Labs (all labs ordered are listed, but only abnormal results are displayed) Labs Reviewed  BASIC METABOLIC PANEL WITH GFR - Abnormal; Notable for the following components:      Result Value   Calcium 8.2 (*)    All other components within normal limits  CBC -  Abnormal; Notable for the following components:   MCH 25.4 (*)    RDW 15.9 (*)    All other components within normal limits  PROTIME-INR     EKG  None   RADIOLOGY I have independently visualized and interpreted patient's imaging studies as well as noted the radiology interpretation:  Ultrasound: Negative for DVT  Left knee/tib/fib: No acute osseous injury, severe tricompartmental degenerative changes  Official radiology report(s): DG Tibia/Fibula Left Result Date: 05/29/2023 CLINICAL DATA:  Fall, leg pain EXAM: LEFT TIBIA AND FIBULA - 2 VIEW COMPARISON:  None Available. FINDINGS: Knee described separately. No fracture or dislocation is seen. The visualized soft tissues are unremarkable. IMPRESSION: Negative. Electronically Signed   By: Zadie Herter M.D.   On: 05/29/2023 01:50   DG Knee Complete 4 Views Left Result Date: 05/29/2023 CLINICAL DATA:  Fall, leg pain EXAM: LEFT KNEE - COMPLETE 4+ VIEW COMPARISON:  None Available. FINDINGS: Severe tricompartmental degenerative changes, most  prominent in the medial compartment. No fracture or dislocation is seen. The visualized soft tissues are unremarkable. No suprapatellar knee joint effusion. IMPRESSION: No fracture or dislocation is seen. Severe tricompartmental degenerative changes. Electronically Signed   By: Zadie Herter M.D.   On: 05/29/2023 01:50   US  Venous Img Lower Unilateral Left (DVT) Result Date: 05/29/2023 CLINICAL DATA:  [id number]. Left lower extremity pain. Previous history of left lower extremity DVT. EXAM: LEFT LOWER EXTREMITY VENOUS DOPPLER ULTRASOUND TECHNIQUE: Gray-scale sonography with compression, as well as color and duplex ultrasound, were performed to evaluate the deep venous system(s) from the level of the common femoral vein through the popliteal and proximal calf veins. COMPARISON:  None Available. FINDINGS: VENOUS Normal compressibility of the common femoral, superficial femoral, and popliteal veins, as well as the visualized calf veins. Visualized portions of profunda femoral vein and great saphenous vein unremarkable. No filling defects to suggest DVT on grayscale or color Doppler imaging. Doppler waveforms show normal direction of venous flow, normal respiratory plasticity and response to augmentation. Limited views of the contralateral common femoral vein are unremarkable. OTHER None. Limitations: none IMPRESSION: Negative. Electronically Signed   By: Denman Fischer M.D.   On: 05/29/2023 00:55     PROCEDURES:  Critical Care performed: No  Procedures   MEDICATIONS ORDERED IN ED: Medications  oxyCODONE-acetaminophen  (PERCOCET/ROXICET) 5-325 MG per tablet 1 tablet (1 tablet Oral Given 05/29/23 0133)     IMPRESSION / MDM / ASSESSMENT AND PLAN / ED COURSE  I reviewed the triage vital signs and the nursing notes.                             53 year old female presenting with fall with left knee/shin pain.  Differential diagnosis includes but is not limited to DVT, fracture, dislocation, contusion,  etc.  I personally reviewed patient's records and note her outside hospital ED visit from 05/10/2023 as well as her PCP follow-up on 05/11/2023.  Patient's presentation is most consistent with acute complicated illness / injury requiring diagnostic workup.  Laboratory results and DVT ultrasound negative.  Will obtain plain film images, administer Percocet for pain and reassess.  Clinical Course as of 05/29/23 0224  Tue May 29, 2023  0223 Updated patient on negative x-rays.  Will apply Ace wrap, provide walker, discharged home with as needed Percocet and patient will follow-up with orthopedics as needed.  Strict return precautions given.  Patient verbalizes understanding and agrees with plan of care. [JS]  Clinical Course User Index [JS] Norlene Beavers, MD     FINAL CLINICAL IMPRESSION(S) / ED DIAGNOSES   Final diagnoses:  Left leg pain  Acute pain of left knee     Rx / DC Orders   ED Discharge Orders     None        Note:  This document was prepared using Dragon voice recognition software and may include unintentional dictation errors.   Taber Sweetser J, MD 05/29/23 [phone]

## 2023-05-29 NOTE — ED Notes (Signed)
 ACE wrap applied to Right knee and walker adjusted to pt's height. Pt states ace wrap feels comfortable. Pt ambulatory with steady and even gait.

## 2023-06-18 IMAGING — CR DG LUMBAR SPINE 2-3V
1 series · 3 of 3 positions shown · non-contrast
Comparison: None.

CLINICAL DATA: Right leg radicular pain

EXAM:
LUMBAR SPINE - 2-3 VIEW

[Series 1: dg lumbar spine 2-3 views · 0.14mm/px · 3 of 3 slices shown]
[im 1/3]
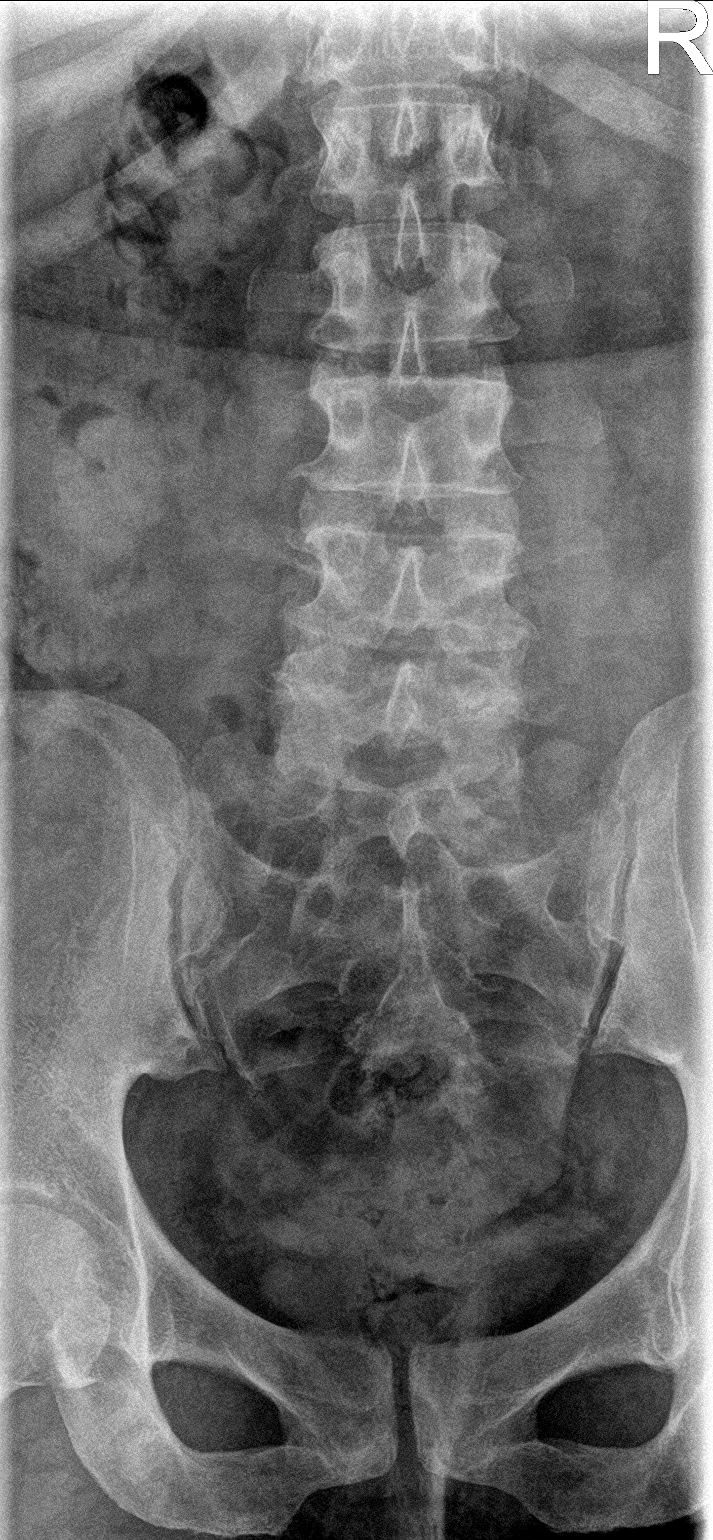
[im 2/3]
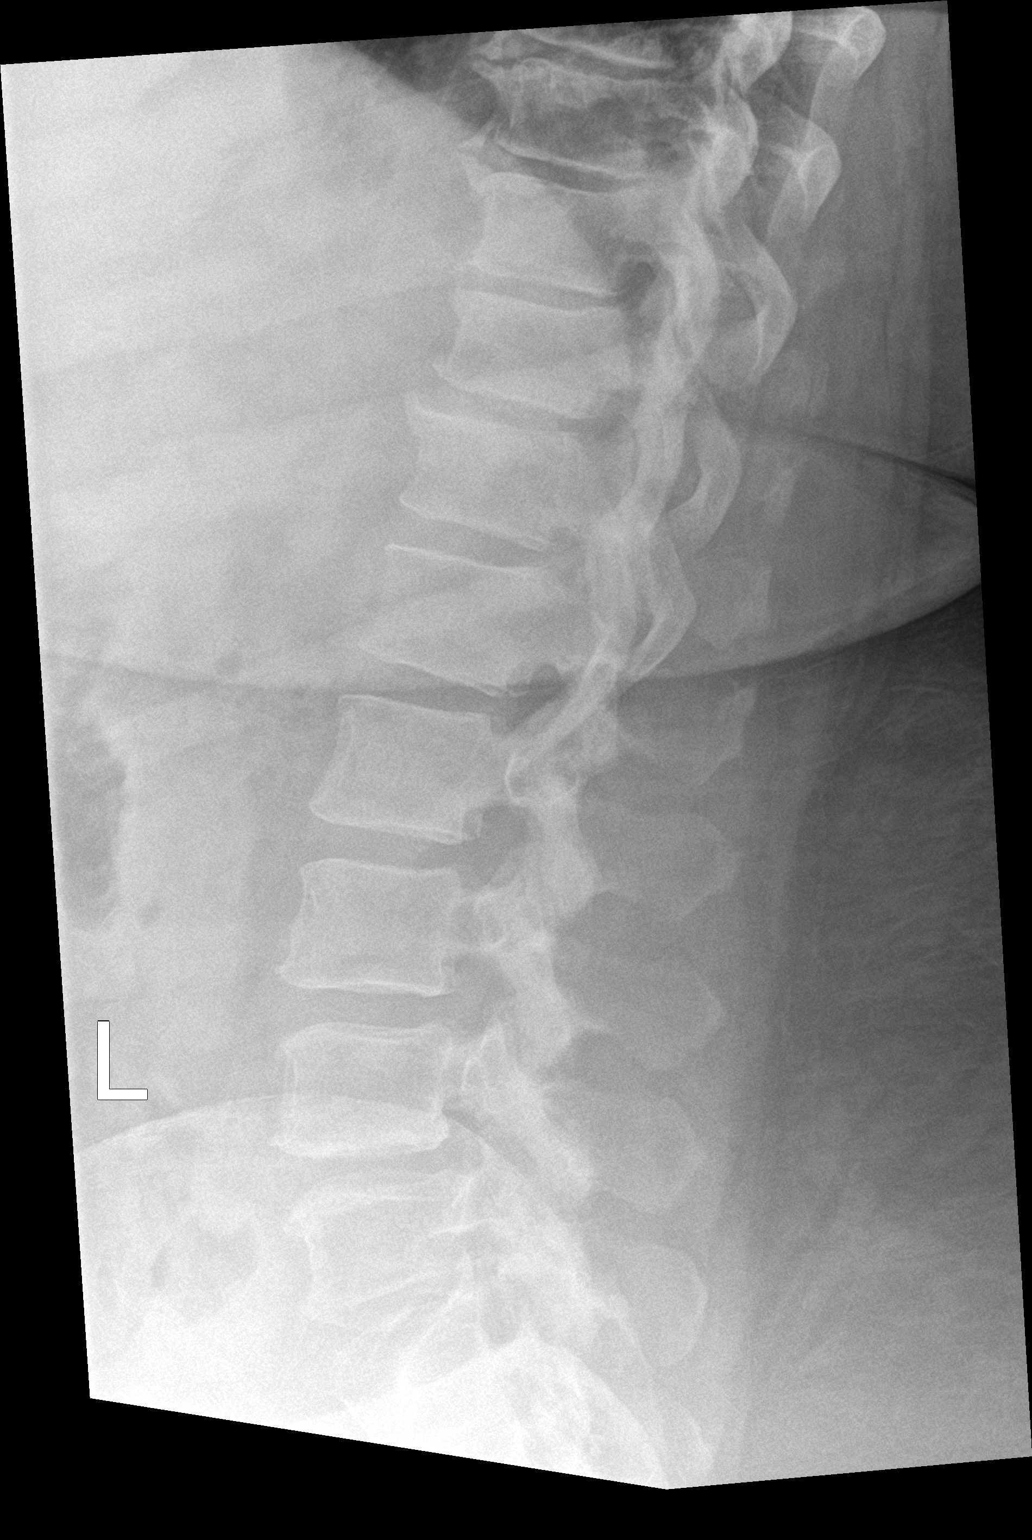
[im 3/3]
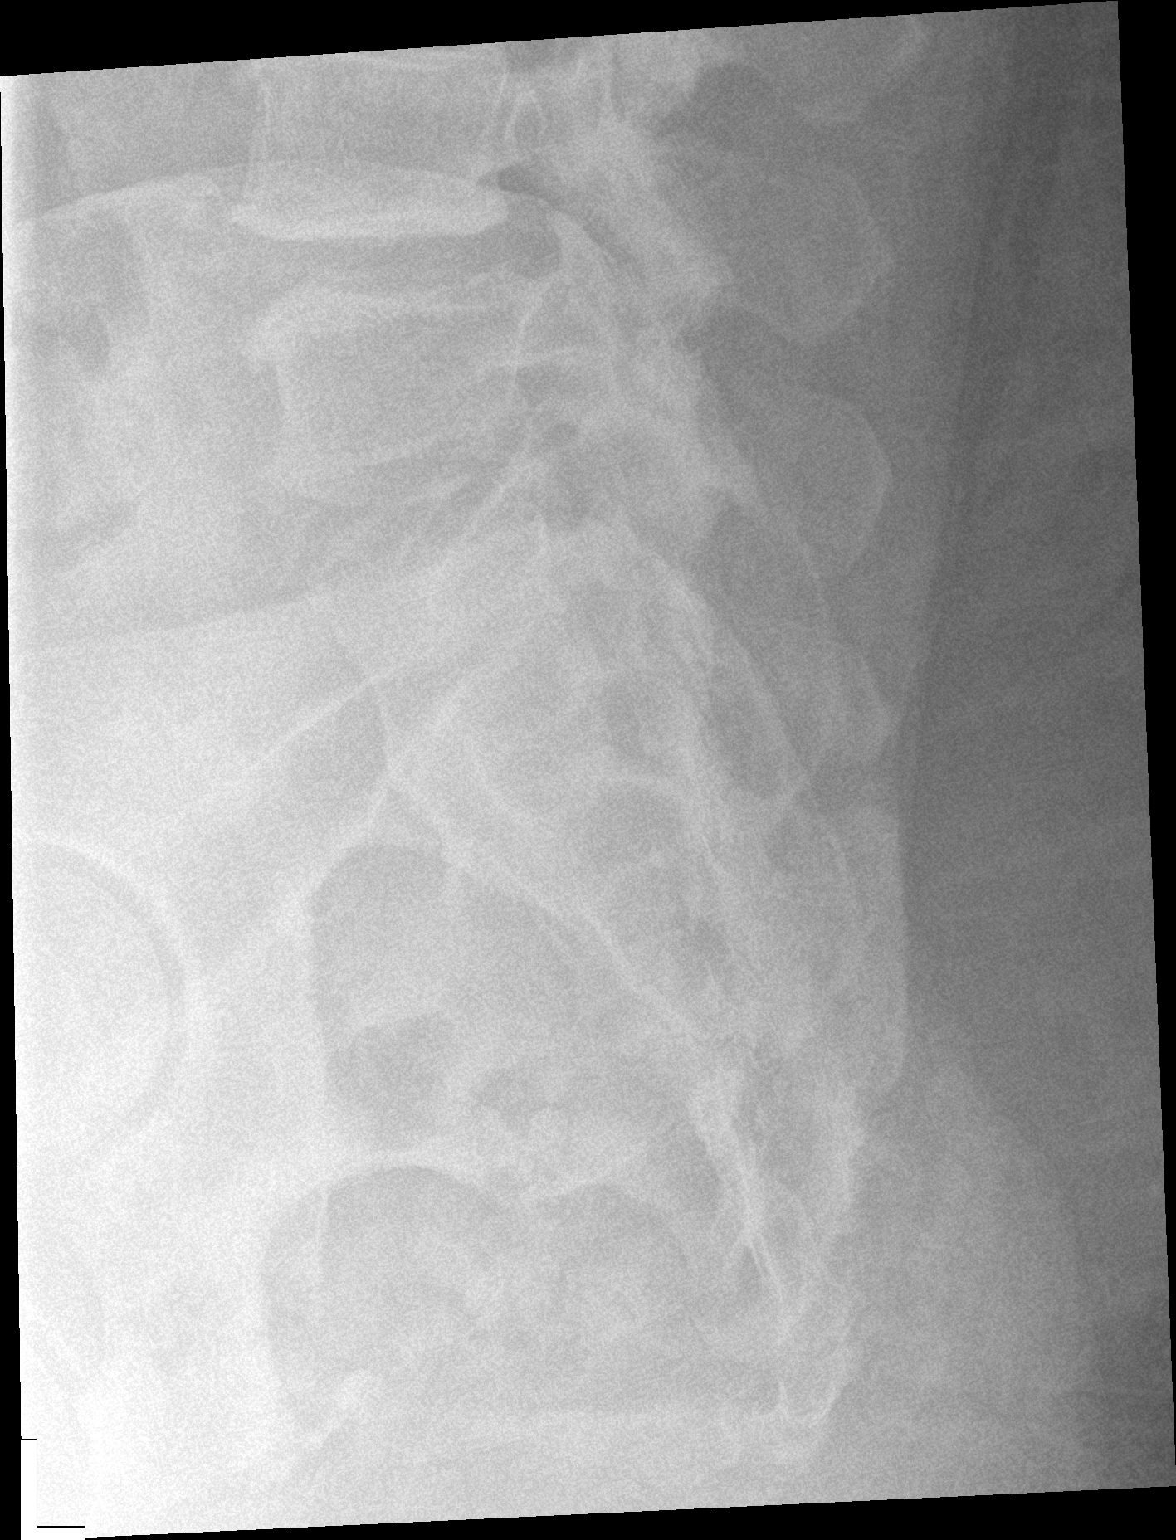

[3 of 3 positions shown; findings below may reference images not displayed]

FINDINGS: Normal lumbar lordosis. 5 mm anterolisthesis of L4 upon L5 and 3 mm
anterolisthesis of L3 upon L4 is likely degenerative in nature. No
acute fracture of the lumbar spine. Vertebral body height has been
preserved. Mild intervertebral disc space narrowing and endplate
remodeling at L3-S1 is in keeping with changes of mild degenerative
disc disease. Facet arthrosis at these levels is not well profiled
on this exam. Paraspinal soft tissues are unremarkable.
IMPRESSION: Mild degenerative disc and degenerative joint disease L3-S1.

## 2023-08-14 ENCOUNTER — Other Ambulatory Visit: Payer: Self-pay

## 2023-08-14 MED ORDER — APIXABAN 5 MG PO TABS: 5.0000 mg | ORAL_TABLET | Freq: Two times a day (BID) | ORAL | 11 refills | Status: AC

## 2023-08-14 MED ORDER — AMITRIPTYLINE HCL 100 MG PO TABS: 100.0000 mg | ORAL_TABLET | Freq: Every day | ORAL | 11 refills | Status: AC

## 2023-08-14 MED ORDER — AMLODIPINE BESYLATE 5 MG PO TABS: 5.0000 mg | ORAL_TABLET | Freq: Every day | ORAL | 11 refills | Status: AC

## 2023-08-24 ENCOUNTER — Other Ambulatory Visit: Payer: Self-pay

## 2023-08-29 ENCOUNTER — Emergency Department

## 2023-08-29 ENCOUNTER — Emergency Department
Admission: EM | Admit: 2023-08-29 | Discharge: 2023-08-29 | Disposition: A | Discharge status: Home / Self Care | Source: Ambulatory Visit | Attending: Emergency Medicine | Admitting: Emergency Medicine

## 2023-08-29 DIAGNOSIS — M79605 Pain in left leg: Secondary | ICD-10-CM | POA: Diagnosis present

## 2023-08-29 DIAGNOSIS — M79662 Pain in left lower leg: Secondary | ICD-10-CM | POA: Diagnosis not present

## 2023-08-29 DIAGNOSIS — I10 Essential (primary) hypertension: Secondary | ICD-10-CM | POA: Diagnosis not present

## 2023-08-29 HISTORY — DX: Acute embolism and thrombosis of unspecified deep veins of unspecified lower extremity: I82.409

## 2023-08-29 HISTORY — DX: Essential (primary) hypertension: I10

## 2023-08-29 LAB — COMPREHENSIVE METABOLIC PANEL WITH GFR
ALT: 13 U/L (ref 0–44)
AST: 17 U/L (ref 15–41)
Albumin: 3 g/dL — ABNORMAL LOW (ref 3.5–5.0)
Alkaline Phosphatase: 90 U/L (ref 38–126)
Anion gap: 9 (ref 5–15)
BUN: 11 mg/dL (ref 6–20)
CO2: 24 mmol/L (ref 22–32)
Calcium: 8.6 mg/dL — ABNORMAL LOW (ref 8.9–10.3)
Chloride: 107 mmol/L (ref 98–111)
Creatinine, Ser: 0.77 mg/dL (ref 0.44–1.00)
GFR, Estimated: >60 mL/min (ref >60)
Glucose, Bld: 135 mg/dL — ABNORMAL HIGH (ref 70–99)
Potassium: 3.6 mmol/L (ref 3.5–5.1)
Sodium: 140 mmol/L (ref 135–145)
Total Bilirubin: 0.8 mg/dL (ref 0.0–1.2)
Total Protein: 7 g/dL (ref 6.5–8.1)

## 2023-08-29 LAB — CBC WITH DIFFERENTIAL/PLATELET
Abs Immature Granulocytes: 0.02 K/uL (ref 0.00–0.07)
Basophils Absolute: 0 K/uL (ref 0.0–0.1)
Basophils Relative: 1 %
Eosinophils Absolute: 0.2 K/uL (ref 0.0–0.5)
Eosinophils Relative: 3 %
HCT: 42.1 % (ref 36.0–46.0)
Hemoglobin: 13.2 g/dL (ref 12.0–15.0)
Immature Granulocytes: 0 %
Lymphocytes Relative: 37 %
Lymphs Abs: 3 K/uL (ref 0.7–4.0)
MCH: 25 pg — ABNORMAL LOW (ref 26.0–34.0)
MCHC: 31.4 g/dL (ref 30.0–36.0)
MCV: 79.6 fL — ABNORMAL LOW (ref 80.0–100.0)
Monocytes Absolute: 0.6 K/uL (ref 0.1–1.0)
Monocytes Relative: 8 %
Neutro Abs: 4.2 K/uL (ref 1.7–7.7)
Neutrophils Relative %: 51 %
Platelets: 248 K/uL (ref 150–400)
RBC: 5.29 MIL/uL — ABNORMAL HIGH (ref 3.87–5.11)
RDW: 15.2 % (ref 11.5–15.5)
WBC: 8 K/uL (ref 4.0–10.5)
nRBC: 0 % (ref 0.0–0.2)

## 2023-08-29 LAB — PROTIME-INR
INR: 2 — ABNORMAL HIGH (ref 0.8–1.2)
Prothrombin Time: 24 s — ABNORMAL HIGH (ref 11.4–15.2)

## 2023-08-29 NOTE — ED Provider Notes (Signed)
 [facility] Provider Note    Event Date/Time   First MD Initiated Contact with Patient 08/29/23 1626     (approximate)   History   Abnormal diagnostic testing   HPI  Stephanie Booth is a 53 y.o. female history of DVT and hypertension presents emergency department with complaints of leg pain.  Mostly she wants to make sure that her blood clots are gone.  States her PCP told her to come and get a repeat ultrasound.  Denies chest pain or shortness of breath      Physical Exam   Triage Vital Signs: ED Triage Vitals  Encounter Vitals Group     BP 08/29/23 1510 (!) 139/95     Girls Systolic BP Percentile --      Girls Diastolic BP Percentile --      Boys Systolic BP Percentile --      Boys Diastolic BP Percentile --      Pulse Rate 08/29/23 1509 (!) 110     Resp 08/29/23 1509 20     Temp 08/29/23 1510 98.6 F (37 C)     Temp Source 08/29/23 1510 Oral     SpO2 08/29/23 1509 98 %     Weight 08/29/23 1508 (!) 377 lb (171 kg)     Height 08/29/23 1508 5' 10 (1.778 m)     Head Circumference --      Peak Flow --      Pain Score 08/29/23 1507 10     Pain Loc --      Pain Education --      Exclude from Growth Chart --     Most recent vital signs: Vitals:   08/29/23 1509 08/29/23 1510  BP:  (!) 139/95  Pulse: (!) 110   Resp: 20   Temp:  98.6 F (37 C)  SpO2: 98%      General: Awake, no distress.   CV:  Good peripheral perfusion.  Resp:  Normal effort. Abd:  No distention.   Other:  Left knee tender at the joint line, neurovascular intact   ED Results / Procedures / Treatments   Labs (all labs ordered are listed, but only abnormal results are displayed) Labs Reviewed  COMPREHENSIVE METABOLIC PANEL WITH GFR - Abnormal; Notable for the following components:      Result Value   Glucose, Bld 135 (*)    Calcium 8.6 (*)    Albumin 3.0 (*)    All other components within normal limits  CBC WITH DIFFERENTIAL/PLATELET - Abnormal; Notable  for the following components:   RBC 5.29 (*)    MCV 79.6 (*)    MCH 25.0 (*)    All other components within normal limits  PROTIME-INR - Abnormal; Notable for the following components:   Prothrombin Time 24.0 (*)    INR 2.0 (*)    All other components within normal limits     EKG     RADIOLOGY  Ultrasound left lower extremity   PROCEDURES:   Procedures  Critical Care:  no Chief Complaint  Patient presents with   Abnormal diagnostic testing      MEDICATIONS ORDERED IN ED: Medications - No data to display   IMPRESSION / MDM / ASSESSMENT AND PLAN / ED COURSE  I reviewed the triage vital signs and the nursing notes.  Differential diagnosis includes, but is not limited to, joint pain, knee effusion, Baker's cyst, DVT  Patient's presentation is most consistent with acute complicated illness / injury requiring diagnostic workup.   Ultrasound left lower extremity independently reviewed interpreted by me as being negative for acute abnormality  Labs reassuring  I did explain findings to patient.  She is to follow-up with her PCP.  Explained to her that she will need to see Ortho or her regular doctor for knee injection as due to her being on blood thinners would not be appropriate.  She is in agreement treatment plan.  Discharged stable condition      FINAL CLINICAL IMPRESSION(S) / ED DIAGNOSES   Final diagnoses:  Pain of left lower extremity     Rx / DC Orders   ED Discharge Orders     None        Note:  This document was prepared using Dragon voice recognition software and may include unintentional dictation errors.    Gasper Devere ORN, PA-C 08/29/23 1711    Ernest Ronal BRAVO, MD 08/29/23 [phone]

## 2023-08-29 NOTE — Telephone Encounter (Signed)
 Agree with the Dispo-ER

## 2023-08-29 NOTE — ED Triage Notes (Signed)
 Patient states she has been diagnosed with three blood clots to her left lower leg since April; is currently taking Coumadin and Eliquis  and has not missed any doses. PCP wanted patient to be seen and have a repeat US .

## 2023-08-29 NOTE — Telephone Encounter (Signed)
 Provider FYI ONLY Disposition:ED Now Patient Agreed to Disposition       Reason for call: Patient is calling for:  Chief Complaint  Patient presents with  . Leg Pain     Left leg pain since last night  Feels a burning sensation, its swollen Pt uses cane to ambulate  Pt taking blood thinners Pt has hx of DVT's   Actions taken during call: Patient advised to call 911 if having an emergency or have someone assist with transportation to the nearest Emergency Room.  Triage: Triage completed, care advice given per protocol. Triage:Call back parameters given and caller advised of 24 hour nurse triage. Instructed to seek immediate medical attention if new symptoms develop, current symptoms worsen or if you become increasingly concerned. Patient verbalized understanding.     REVONDA COMER, RN [facility] Patient Engagement Center  Patient Engagement Center Documentation    Reason for Disposition . History of prior 'blood clot' in leg or lungs (i.e., deep vein thrombosis, pulmonary embolism)  Additional Information . Negative: Looks like a broken bone or dislocated joint (e.g., crooked or deformed) . Negative: Sounds like a life-threatening emergency to the triager . Negative: Followed a hip injury . Negative: Followed a knee injury . Negative: Followed an ankle or foot injury . Negative: Back pain radiating (shooting) into leg(s) . Negative: Foot pain is the main symptom . Negative: Ankle pain is the main symptom . Negative: Knee pain is the main symptom . Negative: Leg swelling is the main symptom . Negative: Chest pain . Negative: Difficulty breathing . Negative: Entire foot is cool or blue in comparison to other side . Negative: Unable to walk . Negative: Fever and red area (or area very tender to touch) . Negative: Fever and swollen joint . Negative: Thigh or calf pain in only one leg and present > 1 hour . Negative: Thigh, calf, or ankle swelling in only one leg .  Negative: Thigh, calf, or ankle swelling in both legs, but one side is definitely more swollen  Protocols used: Leg Pain-A-[place]

## 2023-08-29 NOTE — ED Notes (Signed)
 Patient transported to Ultrasound

## 2023-09-03 ENCOUNTER — Other Ambulatory Visit: Payer: Self-pay

## 2023-11-12 ENCOUNTER — Other Ambulatory Visit: Payer: Self-pay
